# Patient Record
Sex: Female | Born: 1990 | Hispanic: Yes | Smoking: Never smoker
Health system: Southern US, Community
[De-identification: ages and names within clinical notes are randomized; demographics above are authoritative.]

## PROBLEM LIST (undated history)

## (undated) ENCOUNTER — Emergency Department: Discharge status: Absent without leave | Payer: Self-pay

---

## 2017-07-03 ENCOUNTER — Other Ambulatory Visit: Payer: Self-pay

## 2017-07-03 ENCOUNTER — Emergency Department
Admission: EM | Admit: 2017-07-03 | Discharge: 2017-07-03 | Disposition: A | Discharge status: Home / Self Care | Payer: Self-pay | Attending: Emergency Medicine | Admitting: Emergency Medicine

## 2017-07-03 ENCOUNTER — Emergency Department: Payer: Self-pay

## 2017-07-03 DIAGNOSIS — O200 Threatened abortion: Secondary | ICD-10-CM | POA: Insufficient documentation

## 2017-07-03 DIAGNOSIS — O418X10 Other specified disorders of amniotic fluid and membranes, first trimester, not applicable or unspecified: Secondary | ICD-10-CM

## 2017-07-03 DIAGNOSIS — Z3A10 10 weeks gestation of pregnancy: Secondary | ICD-10-CM | POA: Insufficient documentation

## 2017-07-03 DIAGNOSIS — O418X11 Other specified disorders of amniotic fluid and membranes, first trimester, fetus 1: Secondary | ICD-10-CM | POA: Insufficient documentation

## 2017-07-03 DIAGNOSIS — O468X1 Other antepartum hemorrhage, first trimester: Secondary | ICD-10-CM

## 2017-07-03 LAB — POCT PREGNANCY, URINE: Preg Test, Ur: POSITIVE — AB

## 2017-07-03 LAB — ABO/RH: ABO/RH(D): A POS

## 2017-07-03 LAB — HCG, QUANTITATIVE, PREGNANCY: hCG, Beta Chain, Quant, S: 113625 m[IU]/mL — ABNORMAL HIGH (ref <5)

## 2017-07-03 NOTE — ED Triage Notes (Addendum)
Pt to ER via POV from clinic for vaginal bleeding during pregnancy. Pt reports that she is approx 3 months, unsure of due date. Last period in December. Pt reports light vaginal bleeding that started this morning. Pt reports that she was feeling fetal movement prior to today, has not felt any today.   Pt has not had any prior ultrasound per her report.  Interpreter used for triage.

## 2017-07-03 NOTE — Discharge Instructions (Signed)
Fortunately your blood work and your ultrasound were reassuring.  Please maintain pelvic rest until you are able to follow-up with [facility] this coming week for reevaluation.  Return to the emergency department sooner for any concerns whatsoever.  It was a pleasure to take care of you today, and thank you for coming to our emergency department.  If you have any questions or concerns before leaving please ask the nurse to grab me and I'm more than happy to go through your aftercare instructions again.  If you were prescribed any opioid pain medication today such as Norco, Vicodin, Percocet, morphine, hydrocodone, or oxycodone please make sure you do not drive when you are taking this medication as it can alter your ability to drive safely.  If you have any concerns once you are home that you are not improving or are in fact getting worse before you can make it to your follow-up appointment, please do not hesitate to call 911 and come back for further evaluation.  Merrily BrittleNeil Moni Rothrock, MD  Results for orders placed or performed during the hospital encounter of 07/03/17  hCG, quantitative, pregnancy  Result Value Ref Range   hCG, Beta Chain, Quant, S 113,625 (H) <5 mIU/mL  Pregnancy, urine POC  Result Value Ref Range   Preg Test, Ur POSITIVE (A) NEGATIVE  ABO/Rh  Result Value Ref Range   ABO/RH(D)      A POS Performed at [facility], [address] [place], [place] [address]    [place] Ob Comp Less 14 Wks  Result Date: 07/03/2017 CLINICAL DATA:  Vaginal bleeding, decreased movement. EXAM: OBSTETRIC <14 WK ULTRASOUND TECHNIQUE: Transabdominal ultrasound was performed for evaluation of the gestation as well as the maternal uterus and adnexal regions. COMPARISON:  None. FINDINGS: Intrauterine gestational sac: Single Yolk sac:  Not seen Embryo:  Present Cardiac Activity: Present Heart Rate: 163 bpm MSD:   mm    w     d CRL:   33 mm   10 w 1 d                  US EDC: 01/28/2018 Subchorionic  hemorrhage:  Small Maternal uterus/adnexae: No abnormality identified. No mass or free fluid within either adnexal region. IMPRESSION: 1. Single live intrauterine pregnancy with estimated gestational age of [redacted] weeks and 1 day. Fetal heart rate measured at 163 beats per minute. 2. Small subchorionic bleed. 3. Maternal ovaries appear normal and there is no mass or free fluid identified within either adnexal region. Electronically Signed   By: Bary RichardStan  Maynard M.D.   On: 07/03/2017 15:13

## 2017-07-03 NOTE — ED Provider Notes (Signed)
[facility] Emergency Department Provider Note  ____________________________________________   First MD Initiated Contact with Patient 07/03/17 [phone]     (approximate)  I have reviewed the triage vital signs and the nursing notes.   HISTORY  Chief Complaint Vaginal Bleeding   HPI Julia Underwood is a 27 y.o. female who self presents to the emergency department with mild to moderate intermittent lower pelvic cramping and light vaginal bleeding that began this morning.  She is roughly 3 months pregnant with a desired pregnancy.  She is G5P3.  She was not taking fertility medications.  She denies chest pain shortness of breath.  She reports some nausea no vomiting.  She has not passed out.  Her pain is intermittent.  Nothing particular seems to make it better or worse.  She went to an unknown clinic this morning where a doctor was "unable to hear my baby's heartbeat".  History reviewed. No pertinent past medical history.  There are no active problems to display for this patient.   History reviewed. No pertinent surgical history.  Prior to Admission medications   Not on File    Allergies Patient has no known allergies.  No family history on file.  Social History Social History   Tobacco Use  . Smoking status: Never Smoker  Substance Use Topics  . Alcohol use: No    Frequency: Never  . Drug use: Not on file    Review of Systems Constitutional: No fever/chills Eyes: No visual changes. ENT: No sore throat. Cardiovascular: Denies chest pain. Respiratory: Denies shortness of breath. Gastrointestinal: Positive for abdominal pain.  Is a 4 nausea, no vomiting.  No diarrhea.  No constipation. Genitourinary: Negative for dysuria. Musculoskeletal: Negative for back pain. Skin: Negative for rash. Neurological: Negative for headaches, focal weakness or numbness.   ____________________________________________   PHYSICAL EXAM:  VITAL  SIGNS: ED Triage Vitals  Enc Vitals Group     BP 07/03/17 1417 116/80     Pulse Rate 07/03/17 1417 76     Resp 07/03/17 1417 18     Temp 07/03/17 1417 97.7 F (36.5 C)     Temp Source 07/03/17 1417 Oral     SpO2 07/03/17 1417 100 %     Weight 07/03/17 1418 176 lb (79.8 kg)     Height 07/03/17 1418 5\' 3"  (1.6 m)     Head Circumference --      Peak Flow --      Pain Score 07/03/17 1417 2     Pain Loc --      Pain Edu? --      Excl. in [place]? --     Constitutional: Alert and oriented x4 pleasant cooperative speaks full clear sentences no diaphoresis Eyes: PERRL EOMI. Head: Atraumatic. Nose: No congestion/rhinnorhea. Mouth/Throat: No trismus Neck: No stridor.   Cardiovascular: Normal rate, regular rhythm. Grossly normal heart sounds.  Good peripheral circulation. Respiratory: Normal respiratory effort.  No retractions. Lungs CTAB and moving good air Gastrointestinal: Soft nontender no peritonitis Musculoskeletal: No lower extremity edema   Neurologic:  Normal speech and language. No gross focal neurologic deficits are appreciated. Skin:  Skin is warm, dry and intact. No rash noted. Psychiatric: Mood and affect are normal. Speech and behavior are normal.    ____________________________________________   DIFFERENTIAL includes but not limited to  Ectopic pregnancy, threatened abortion, inevitable abortion, completed abortion, septic abortion ____________________________________________   LABS (all labs ordered are listed, but only abnormal results are displayed)  Labs  Reviewed  HCG, QUANTITATIVE, PREGNANCY - Abnormal; Notable for the following components:      Result Value   hCG, Beta Chain, [place] LongestQuant, S [id number],[id number] (*)    All other components within normal limits  POCT PREGNANCY, URINE - Abnormal; Notable for the following components:   Preg Test, Ur POSITIVE (*)    All other components within normal limits  ABO/RH    Lab work reviewed by me shows the patient is  Rh+ __________________________________________  EKG   ____________________________________________  RADIOLOGY  Ultrasound reviewed by me shows normal fetal heart rate with intrauterine pregnancy and small subchorionic hematoma ____________________________________________   PROCEDURES  Procedure(s) performed: no  Procedures  Critical Care performed: no  Observation: no ____________________________________________   INITIAL IMPRESSION / ASSESSMENT AND PLAN / ED COURSE  Pertinent labs & imaging results that were available during my care of the patient were reviewed by me and considered in my medical decision making (see chart for details).  The patient is hemodynamically stable and well-appearing with roughly 10-week fetus.  She is Rh+ and does not need RhoGam.  She does have a small subchorionic hematoma.  I discussed with the patient the possibility of a miscarriage and the importance of pelvic rest until she is able to follow-up with [facility].  She does not need a referral.  Strict return precautions have been given and the patient is discharged home in improved condition and verbalizes understanding and agreement with the plan.      ____________________________________________   FINAL CLINICAL IMPRESSION(S) / ED DIAGNOSES  Final diagnoses:  Threatened abortion  Subchorionic hematoma in first trimester, single or unspecified fetus      NEW MEDICATIONS STARTED DURING THIS VISIT:  There are no discharge medications for this patient.    Note:  This document was prepared using Dragon voice recognition software and may include unintentional dictation errors.     Merrily Brittleifenbark, Ionia Schey, MD 07/05/17 1309

## 2017-07-03 NOTE — ED Notes (Signed)
Pt reports being 3 months pregnant and having cramps as of last night. Pt reports light vaginal bleed that occurred this morning. Pt was then seen by a doctor at the clinic who was unable to hear the baby's heart beat via ultra sound. Pt was then referred to [facility] ED for further examination.

## 2018-10-23 ENCOUNTER — Other Ambulatory Visit: Payer: Self-pay | Admitting: Family Medicine

## 2018-10-23 DIAGNOSIS — Z20822 Contact with and (suspected) exposure to covid-19: Secondary | ICD-10-CM

## 2018-10-29 LAB — NOVEL CORONAVIRUS, NAA: SARS-CoV-2, NAA: NOT DETECTED

## 2018-11-05 ENCOUNTER — Emergency Department: Payer: Self-pay

## 2018-11-05 ENCOUNTER — Encounter: Payer: Self-pay | Admitting: Emergency Medicine

## 2018-11-05 DIAGNOSIS — Y929 Unspecified place or not applicable: Secondary | ICD-10-CM | POA: Insufficient documentation

## 2018-11-05 DIAGNOSIS — Y998 Other external cause status: Secondary | ICD-10-CM | POA: Insufficient documentation

## 2018-11-05 DIAGNOSIS — Y9389 Activity, other specified: Secondary | ICD-10-CM | POA: Insufficient documentation

## 2018-11-05 DIAGNOSIS — X509XXA Other and unspecified overexertion or strenuous movements or postures, initial encounter: Secondary | ICD-10-CM | POA: Insufficient documentation

## 2018-11-05 DIAGNOSIS — M25512 Pain in left shoulder: Secondary | ICD-10-CM | POA: Insufficient documentation

## 2018-11-05 DIAGNOSIS — S29012A Strain of muscle and tendon of back wall of thorax, initial encounter: Secondary | ICD-10-CM | POA: Insufficient documentation

## 2018-11-05 NOTE — ED Triage Notes (Signed)
Patient c/o left back/shoulder pain radiating to left chest and arm. Pain is intermittent - patient not currently in pain. Patient describes pain in arm as a numbness. 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 Patient c/o chest pain with deep inspiration. Patient only speaks spanish, interpreter used to triage patient.   Per MD Joni Fears only chest xray and EKG to be performed at this time.

## 2018-11-06 ENCOUNTER — Emergency Department
Admission: EM | Admit: 2018-11-06 | Discharge: 2018-11-06 | Disposition: A | Discharge status: Home / Self Care | Payer: Self-pay | Attending: Emergency Medicine | Admitting: Emergency Medicine

## 2018-11-06 DIAGNOSIS — S29012A Strain of muscle and tendon of back wall of thorax, initial encounter: Secondary | ICD-10-CM

## 2018-11-06 MED ORDER — CYCLOBENZAPRINE HCL 10 MG PO TABS: 10.0000 mg | ORAL_TABLET | Freq: Three times a day (TID) | ORAL | 0 refills | Status: DC | PRN

## 2018-11-06 MED ORDER — LIDOCAINE 5 % EX PTCH
1.0000 | MEDICATED_PATCH | CUTANEOUS | Status: DC
  Administered 2018-11-06: 1 via TRANSDERMAL
  Filled 2018-11-06: qty 1

## 2018-11-06 MED ORDER — KETOROLAC TROMETHAMINE 10 MG PO TABS
10.0000 mg | ORAL_TABLET | Freq: Once | ORAL | Status: AC
  Administered 2018-11-06: 10 mg via ORAL
  Filled 2018-11-06: qty 1

## 2018-11-06 MED ORDER — CYCLOBENZAPRINE HCL 10 MG PO TABS
10.0000 mg | ORAL_TABLET | Freq: Once | ORAL | Status: AC
  Administered 2018-11-06: 10 mg via ORAL
  Filled 2018-11-06: qty 1

## 2018-11-06 NOTE — ED Provider Notes (Signed)
[facility] Emergency Department Provider Note   First MD Initiated Contact with Patient 11/06/18 0224     (approximate)  I have reviewed the triage vital signs and the nursing notes.   HISTORY  Chief Complaint Back Pain and Chest Pain    HPI Julia Underwood is a 28 y.o. female presents to the emergency department secondary to left upper back/shoulder discomfort x3 days after painting with her left arm.  Patient denies any chest discomfort.  Patient denies any weakness or neck discomfort.  Patient states that pain is worsened with movement of the left arm/shoulder.  Patient states that pain is also worsened with palpation.   History reviewed. No pertinent past medical history.  There are no active problems to display for this patient.   History reviewed. No pertinent surgical history.  Prior to Admission medications   Not on File    Allergies Patient has no known allergies.  No family history on file.  Social History Social History   Tobacco Use  . Smoking status: Never Smoker  . Smokeless tobacco: Never Used  Substance Use Topics  . Alcohol use: No    Frequency: Never  . Drug use: Not on file    Review of Systems Constitutional: No fever/chills Eyes: No visual changes. ENT: No sore throat. Cardiovascular: Denies chest pain. Respiratory: Denies shortness of breath. Gastrointestinal: No abdominal pain.  No nausea, no vomiting.  No diarrhea.  No constipation. Genitourinary: Negative for dysuria. Musculoskeletal: Negative for neck pain.  Positive for back pain.  Positive for left shoulder discomfort. Integumentary: Negative for rash. Neurological: Negative for headaches, focal weakness or numbness.   ____________________________________________   PHYSICAL EXAM:  VITAL SIGNS: ED Triage Vitals  Enc Vitals Group     BP 11/05/18 2154 115/79     Pulse Rate 11/05/18 2154 83     Resp 11/05/18 2154 18     Temp 11/05/18  2154 99.3 F (37.4 C)     Temp src --      SpO2 11/05/18 2154 100 %     Weight 11/05/18 2211 59 kg (130 lb)     Height 11/05/18 2211 1.626 m (5\' 4" )     Head Circumference --      Peak Flow --      Pain Score 11/05/18 2211 0     Pain Loc --      Pain Edu? --      Excl. in [place]? --     Constitutional: Alert and oriented. Well appearing and in no acute distress. Eyes: Conjunctivae are normal.  Mouth/Throat: Mucous membranes are moist. Neck: No stridor. Cardiovascular: Normal rate, regular rhythm. Good peripheral circulation. Grossly normal heart sounds. Respiratory: Normal respiratory effort.  No retractions. No audible wheezing. Gastrointestinal: Soft and nontender. No distention.  Musculoskeletal: Pain to palpation of the left rhomboid/trapezius muscles Neurologic:  Normal speech and language. No gross focal neurologic deficits are appreciated.  Skin:  Skin is warm, dry and intact. No rash noted. Psychiatric: Mood and affect are normal. Speech and behavior are normal.  ____  RADIOLOGY I, [place] Dezzie Badilla, personally viewed and evaluated these images (plain radiographs) as part of my medical decision making, as well as reviewing the written report by the radiologist.  ED MD interpretation: No active cardiopulmonary disease noted on chest x-ray per radiologist.  Official radiology report(s): Dg Chest 2 View  Result Date: 11/05/2018 CLINICAL DATA:  Initial evaluation for acute left back/shoulder pain radiating into the  left chest and arm. EXAM: CHEST - 2 VIEW COMPARISON:  None available. FINDINGS: The cardiac and mediastinal silhouettes are within normal limits. The lungs are normally inflated. No focal infiltrates. No pulmonary edema. Minimal blunting of the costophrenic angles bilaterally could reflect trace pleural effusions. No pneumothorax. No acute osseous finding. IMPRESSION: 1. Minimal blunting of the costophrenic angles bilaterally, which could reflect trace bilateral pleural  effusions. 2. No other active cardiopulmonary disease. Electronically Signed   By: Rise MuBenjamin  McClintock M.D.   On: 11/05/2018 23:41      Procedures   ____________________________________________   INITIAL IMPRESSION / MDM / ASSESSMENT AND PLAN / ED COURSE  As part of my medical decision making, I reviewed the following data within the electronic MEDICAL RECORD NUMBER   28 year old female presented with above-stated history and physical exam consistent with musculoskeletal pain less likely secondary to muscular strain.  Lidoderm patch applied to the rhomboid muscles.  Patient given Toradol in the emergency department.  Patient be was prescribed Flexeril for home.  ____________________________________________  FINAL CLINICAL IMPRESSION(S) / ED DIAGNOSES  Final diagnoses:  Muscle strain of upper back     MEDICATIONS GIVEN DURING THIS VISIT:  Medications  lidocaine (LIDODERM) 5 % 1 patch (1 patch Transdermal Patch Applied 11/06/18 0229)  ketorolac (TORADOL) tablet 10 mg (has no administration in time range)  cyclobenzaprine (FLEXERIL) tablet 10 mg (has no administration in time range)     ED Discharge Orders    None      *Please note:  Julia Underwood was evaluated in Emergency Department on 11/06/2018 for the symptoms described in the history of present illness. She was evaluated in the context of the global COVID-19 pandemic, which necessitated consideration that the patient might be at risk for infection with the SARS-CoV-2 virus that causes COVID-19. Institutional protocols and algorithms that pertain to the evaluation of patients at risk for COVID-19 are in a state of rapid change based on information released by regulatory bodies including the CDC and federal and state organizations. These policies and algorithms were followed during the patient's care in the ED.  Some ED evaluations and interventions may be delayed as a result of limited staffing during the pandemic.*   Note:  This document was prepared using Dragon voice recognition software and may include unintentional dictation errors.   Darci CurrentBrown, [place] N, MD 11/06/18 [phone]

## 2018-11-06 NOTE — ED Notes (Signed)
Pt signed physical copy of discharge paperwork.

## 2018-12-21 ENCOUNTER — Encounter: Payer: Self-pay | Admitting: Emergency Medicine

## 2018-12-21 ENCOUNTER — Emergency Department
Admission: EM | Admit: 2018-12-21 | Discharge: 2018-12-21 | Disposition: A | Discharge status: Home / Self Care | Payer: Self-pay | Attending: Student | Admitting: Student

## 2018-12-21 ENCOUNTER — Other Ambulatory Visit: Payer: Self-pay

## 2018-12-21 DIAGNOSIS — H6123 Impacted cerumen, bilateral: Secondary | ICD-10-CM | POA: Insufficient documentation

## 2018-12-21 NOTE — ED Provider Notes (Signed)
[facility] Emergency Department Provider Note   ____________________________________________   First MD Initiated Contact with Patient 12/21/18 [phone]     (approximate)  I have reviewed the triage vital signs and the nursing notes.   HISTORY Via interpreter Chief Complaint Cerumen Impaction    HPI Julia Underwood is a 28 y.o. female patient complain of mild hearing loss secondary to wax buildup in both ears.  Patient states this is a intermittent condition occurs approximately every 6 months.  Patient state she cannot see her PCP until next week.         History reviewed. No pertinent past medical history.  There are no active problems to display for this patient.   History reviewed. No pertinent surgical history.  Prior to Admission medications   Not on File    Allergies Patient has no known allergies.  History reviewed. No pertinent family history.  Social History Social History   Tobacco Use  . Smoking status: Never Smoker  . Smokeless tobacco: Never Used  Substance Use Topics  . Alcohol use: No    Frequency: Never  . Drug use: Not on file    Review of Systems Constitutional: No fever/chills Eyes: No visual changes. ENT: No sore throat.  Decreased hearing. Cardiovascular: Denies chest pain. Respiratory: Denies shortness of breath. Gastrointestinal: No abdominal pain.  No nausea, no vomiting.  No diarrhea.  No constipation. Genitourinary: Negative for dysuria. Musculoskeletal: Negative for back pain. Skin: Negative for rash. Neurological: Negative for headaches, focal weakness or numbness.   ____________________________________________   PHYSICAL EXAM:  VITAL SIGNS: ED Triage Vitals  Enc Vitals Group     BP 12/21/18 1613 117/86     Pulse Rate 12/21/18 1612 73     Resp 12/21/18 1612 14     Temp 12/21/18 1612 99.2 F (37.3 C)     Temp Source 12/21/18 1612 Oral     SpO2 12/21/18 1612 99 %     Weight  12/21/18 1611 130 lb (59 kg)     Height --      Head Circumference --      Peak Flow --      Pain Score 12/21/18 1611 0     Pain Loc --      Pain Edu? --      Excl. in [place]? --     Constitutional: Alert and oriented. Well appearing and in no acute distress. EARS: Bilateral cerumen impaction. Cardiovascular: Normal rate, regular rhythm. Grossly normal heart sounds.  Good peripheral circulation. Respiratory: Normal respiratory effort.  No retractions. Lungs CTAB. Neurologic:  Normal speech and language. No gross focal neurologic deficits are appreciated. No gait instability. Skin:  Skin is warm, dry and intact. No rash noted. Psychiatric: Mood and affect are normal. Speech and behavior are normal.  ____________________________________________   LABS (all labs ordered are listed, but only abnormal results are displayed)  Labs Reviewed - No data to display ____________________________________________  EKG   ____________________________________________  RADIOLOGY  ED MD interpretation:    Official radiology report(s): No results found.  ____________________________________________   PROCEDURES  Procedure(s) performed (including Critical Care):  .Ear Cerumen Removal  Date/Time: 12/21/2018 4:58 PM Performed by: McLamb, Berniece Salines, RN Authorized by: Sable Feil, PA-C   Consent:    Consent obtained:  Verbal   Consent given by:  Patient   Risks discussed:  Pain, incomplete removal and dizziness Procedure details:    Location:  R ear and L ear  Procedure type: irrigation   Post-procedure details:    Inspection:  TM intact   Hearing quality:  Normal   Patient tolerance of procedure:  Tolerated well, no immediate complications     ____________________________________________   INITIAL IMPRESSION / ASSESSMENT AND PLAN / ED COURSE  As part of my medical decision making, I reviewed the following data within the electronic MEDICAL RECORD NUMBER         Julia  Anzora de Ardyth Underwood was evaluated in Emergency Department on 12/21/2018 for the symptoms described in the history of present illness. She was evaluated in the context of the global COVID-19 pandemic, which necessitated consideration that the patient might be at risk for infection with the SARS-CoV-2 virus that causes COVID-19. Institutional protocols and algorithms that pertain to the evaluation of patients at risk for COVID-19 are in a state of rapid change based on information released by regulatory bodies including the CDC and federal and state organizations. These policies and algorithms were followed during the patient's care in the ED.  Mild hearing loss secondary to cerumen impaction.  Complaint resolved status post bilateral ear irrigation.  Patient advised follow-up PCP.      ____________________________________________   FINAL CLINICAL IMPRESSION(S) / ED DIAGNOSES  Final diagnoses:  Hearing loss of both ears due to cerumen impaction     ED Discharge Orders    None       Note:  This document was prepared using Dragon voice recognition software and may include unintentional dictation errors.    Joni ReiningSmith, Jeyden Coffelt K, PA-C 12/21/18 1730    Miguel AschoffMonks, Sarah L., MD 12/22/18 [phone]

## 2018-12-21 NOTE — ED Triage Notes (Signed)
Pt here for wax build up in both ears but worse in left. Denies pain.  Sx X 3 days. Reports called her doctor but they cannot see until Thursday and wanted it taken care of sooner.

## 2018-12-21 NOTE — ED Notes (Signed)
See triage note  Presents with possible wax build up in both ears   States she has tried to clean her ears out  Min wax noted no fever noted

## 2020-01-05 IMAGING — US US OB COMP LESS 14 WK
1 series · 14 of 28 positions shown · non-contrast
Comparison: None.

CLINICAL DATA: Vaginal bleeding, decreased movement.

EXAM:
OBSTETRIC <14 WK ULTRASOUND
TECHNIQUE: Transabdominal ultrasound was performed for evaluation of the
gestation as well as the maternal uterus and adnexal regions.

[Series 1: us ob comp less 14 wk · 0.15mm/px · 54 acquisitions, 14 frames shown]
[im 2/54]
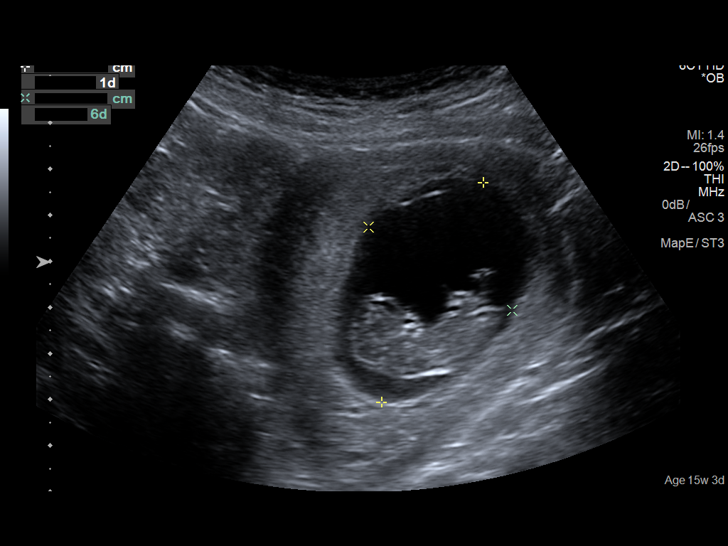
[im 6/54]
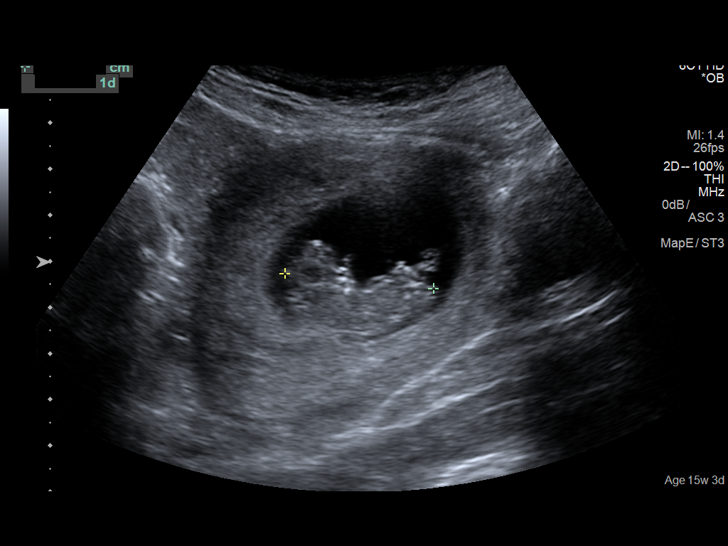
[im 10/54]
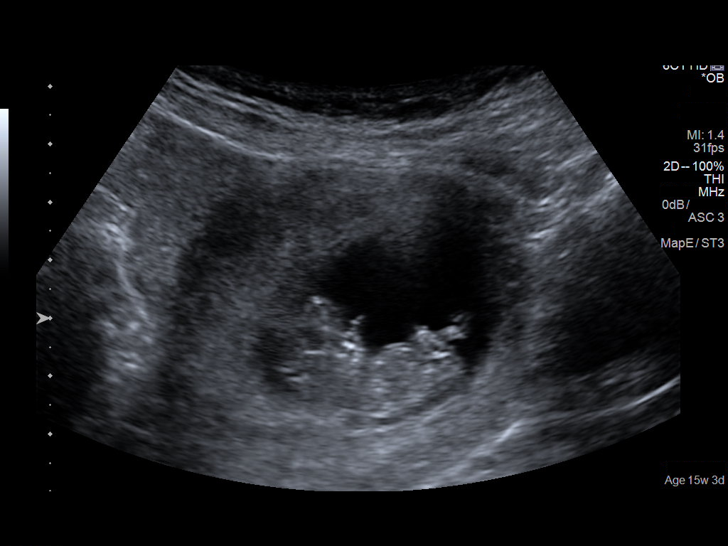
[im 14/54]
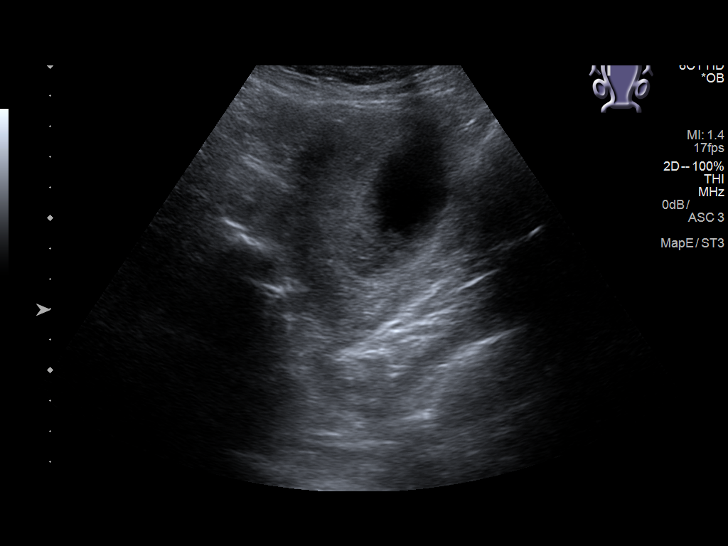
[im 18/54]
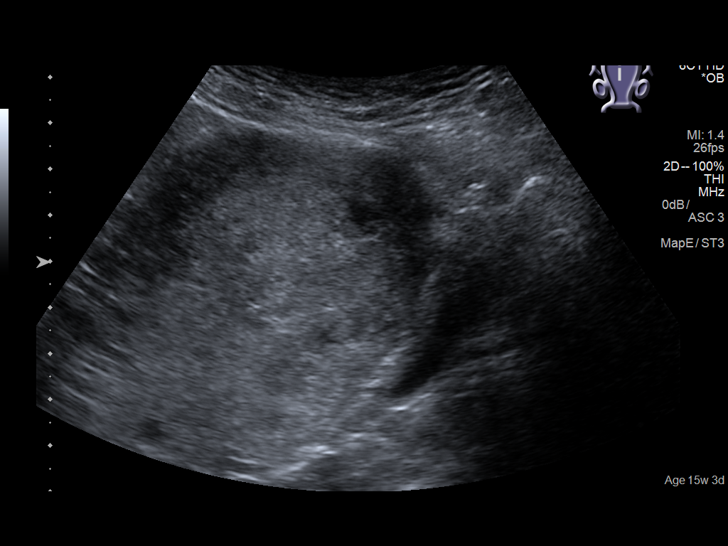
[im 22/54]
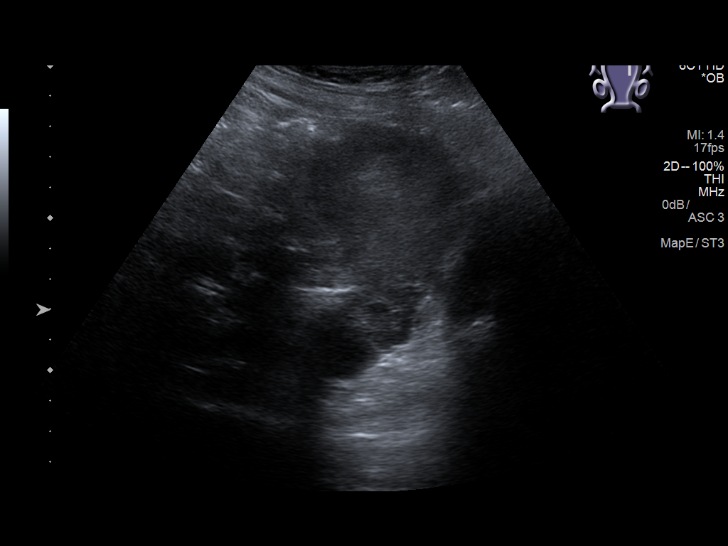
[im 26/54]
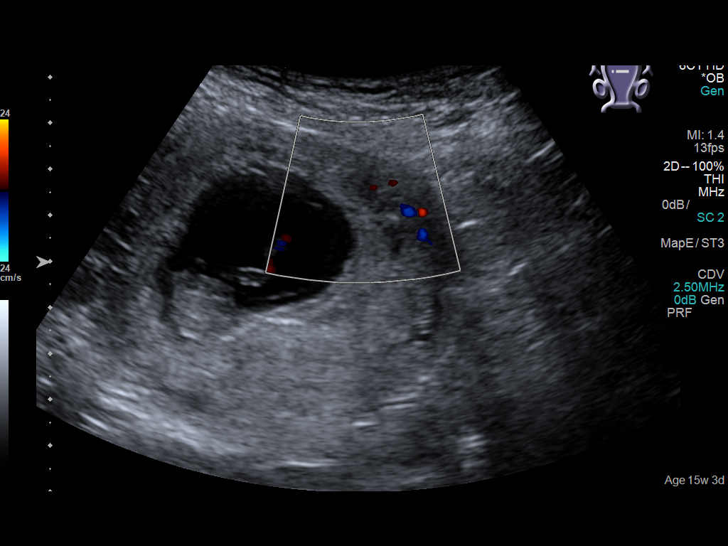
[im 30/54]
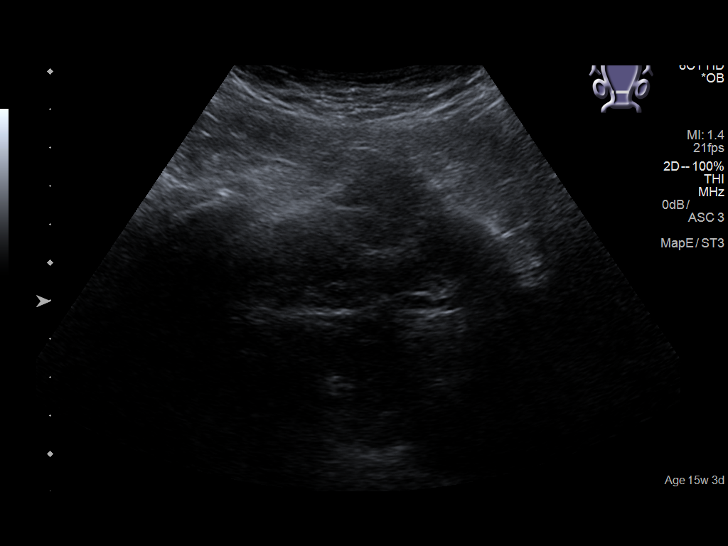
[im 34/54]
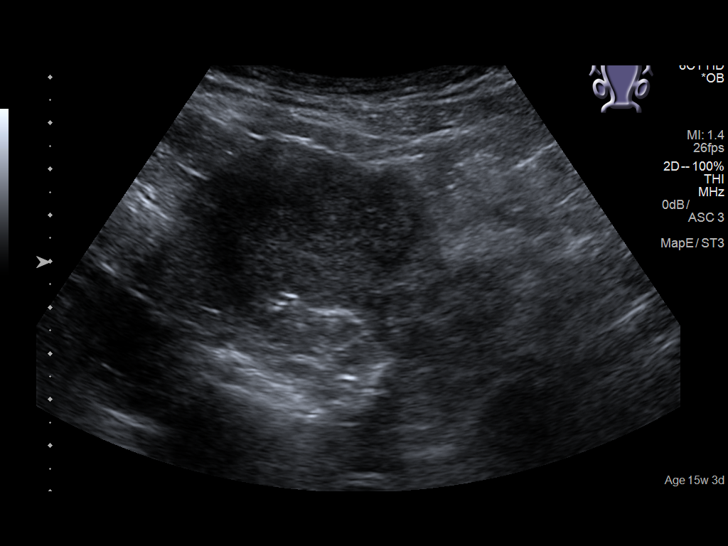
[im 38/54]
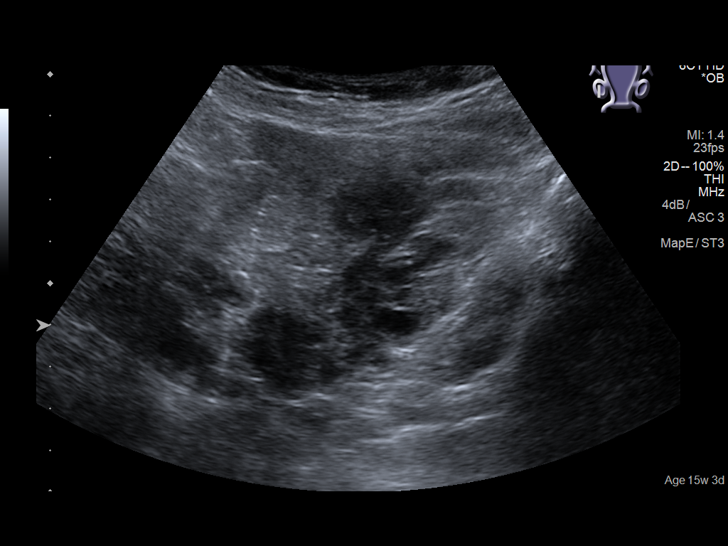
[im 42/54]
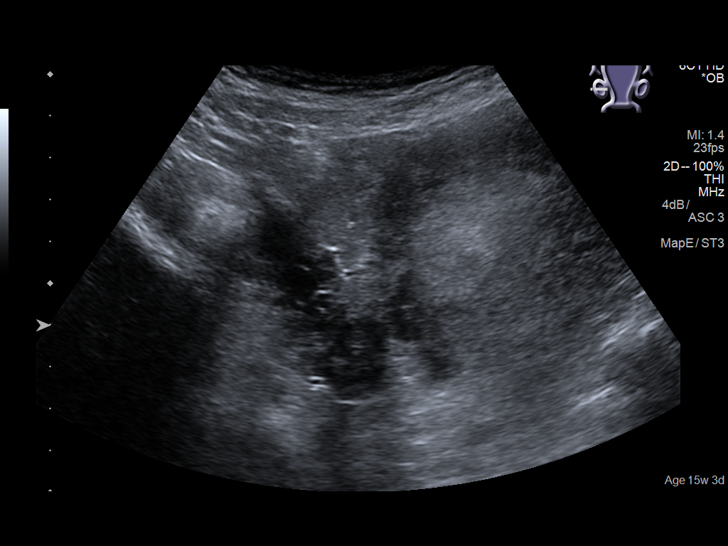
[im 46/54]
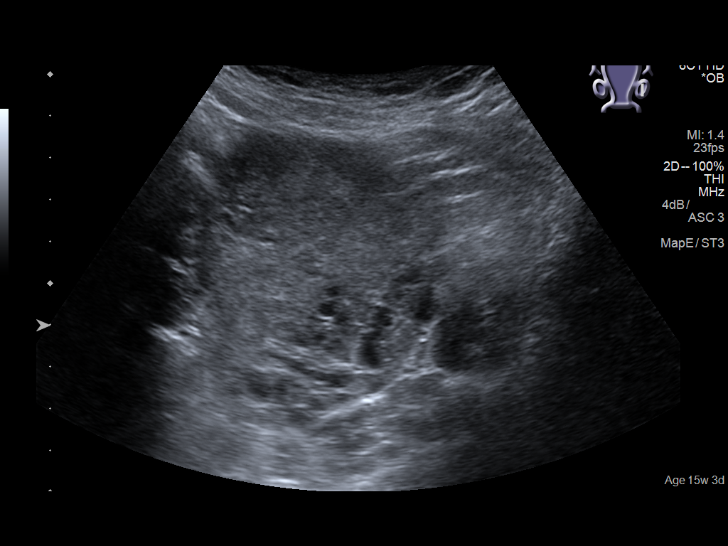
[im 50/54]
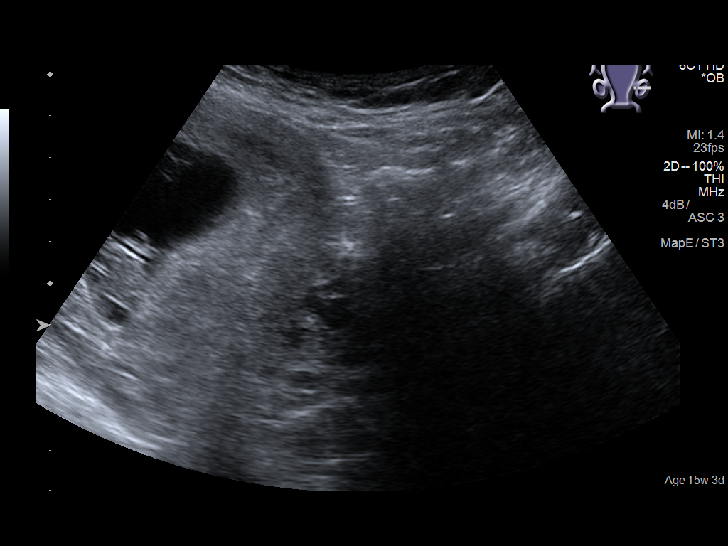
[im 54/54]
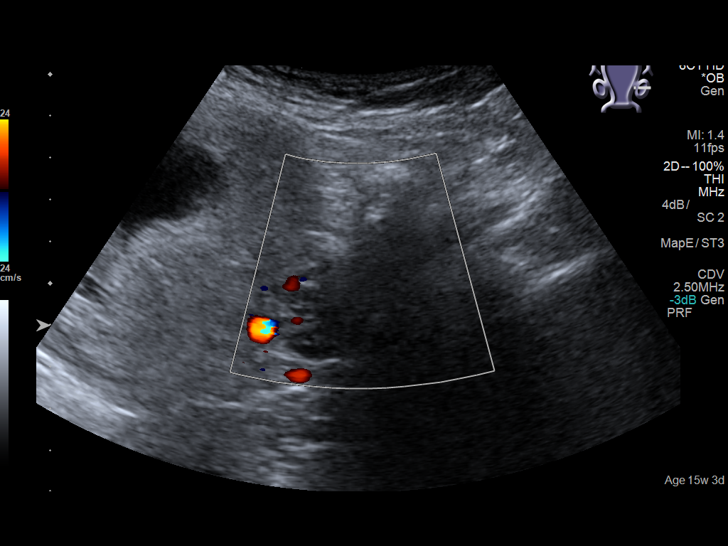

[14 of 28 positions shown; findings below may reference images not displayed]

FINDINGS: Intrauterine gestational sac: Single

Yolk sac:  Not seen

Embryo:  Present

Cardiac Activity: Present

Heart Rate: 163 bpm

MSD:   mm    w     d

CRL:   33 mm   10 w 1 d                  US EDC: 01/28/2018

Subchorionic hemorrhage:  Small

Maternal uterus/adnexae: No abnormality identified. No mass or free
fluid within either adnexal region.
IMPRESSION: 1. Single live intrauterine pregnancy with estimated gestational age
of 10 weeks and 1 day. Fetal heart rate measured at 163 beats per
minute.
2. Small subchorionic bleed.
3. Maternal ovaries appear normal and there is no mass or free fluid
identified within either adnexal region.

## 2021-06-07 IMAGING — CR CHEST - 2 VIEW
1 series · 2 of 2 positions shown · non-contrast
Comparison: None available.

CLINICAL DATA: Initial evaluation for acute left back/shoulder pain
radiating into the left chest and arm.

EXAM:
CHEST - 2 VIEW

[Series 1: dg chest 2 view · 0.14mm/px · 2 of 2 slices shown]
[im 1/2]
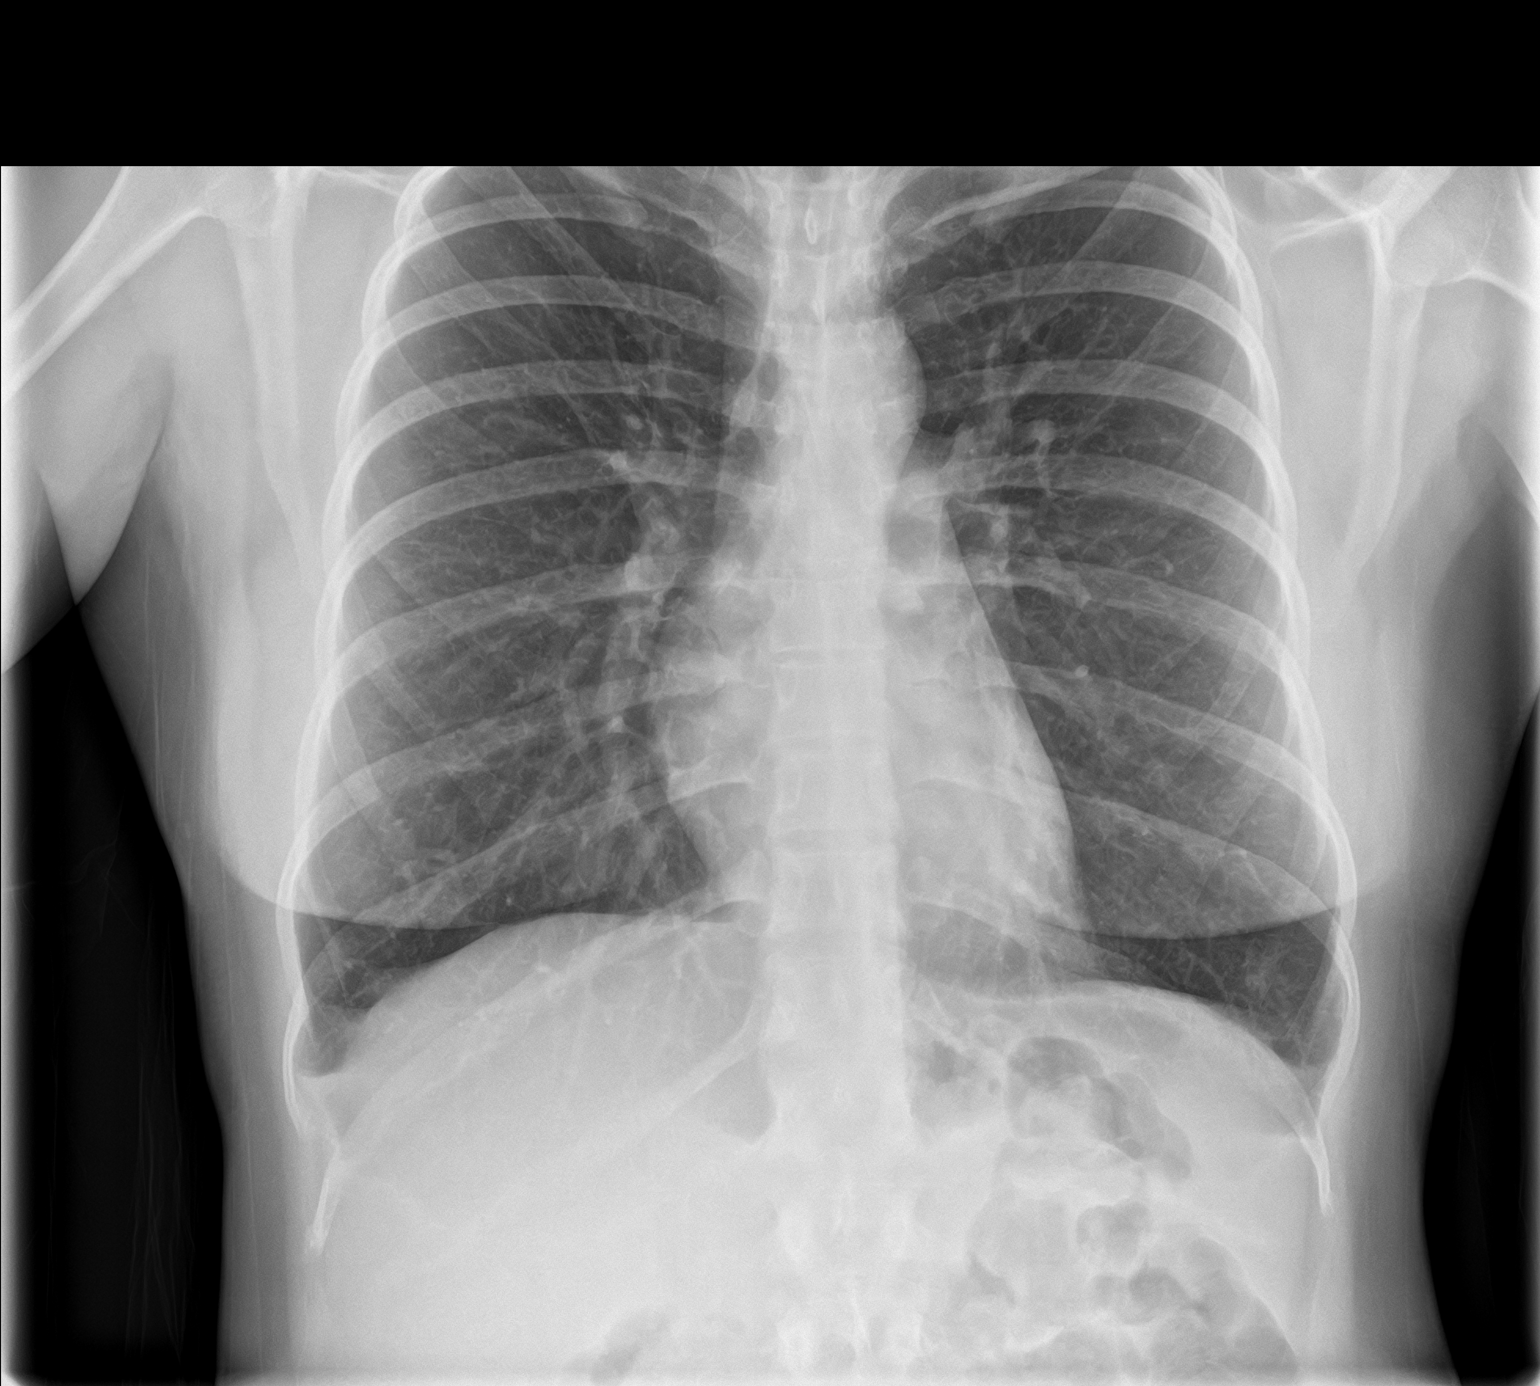
[im 2/2]
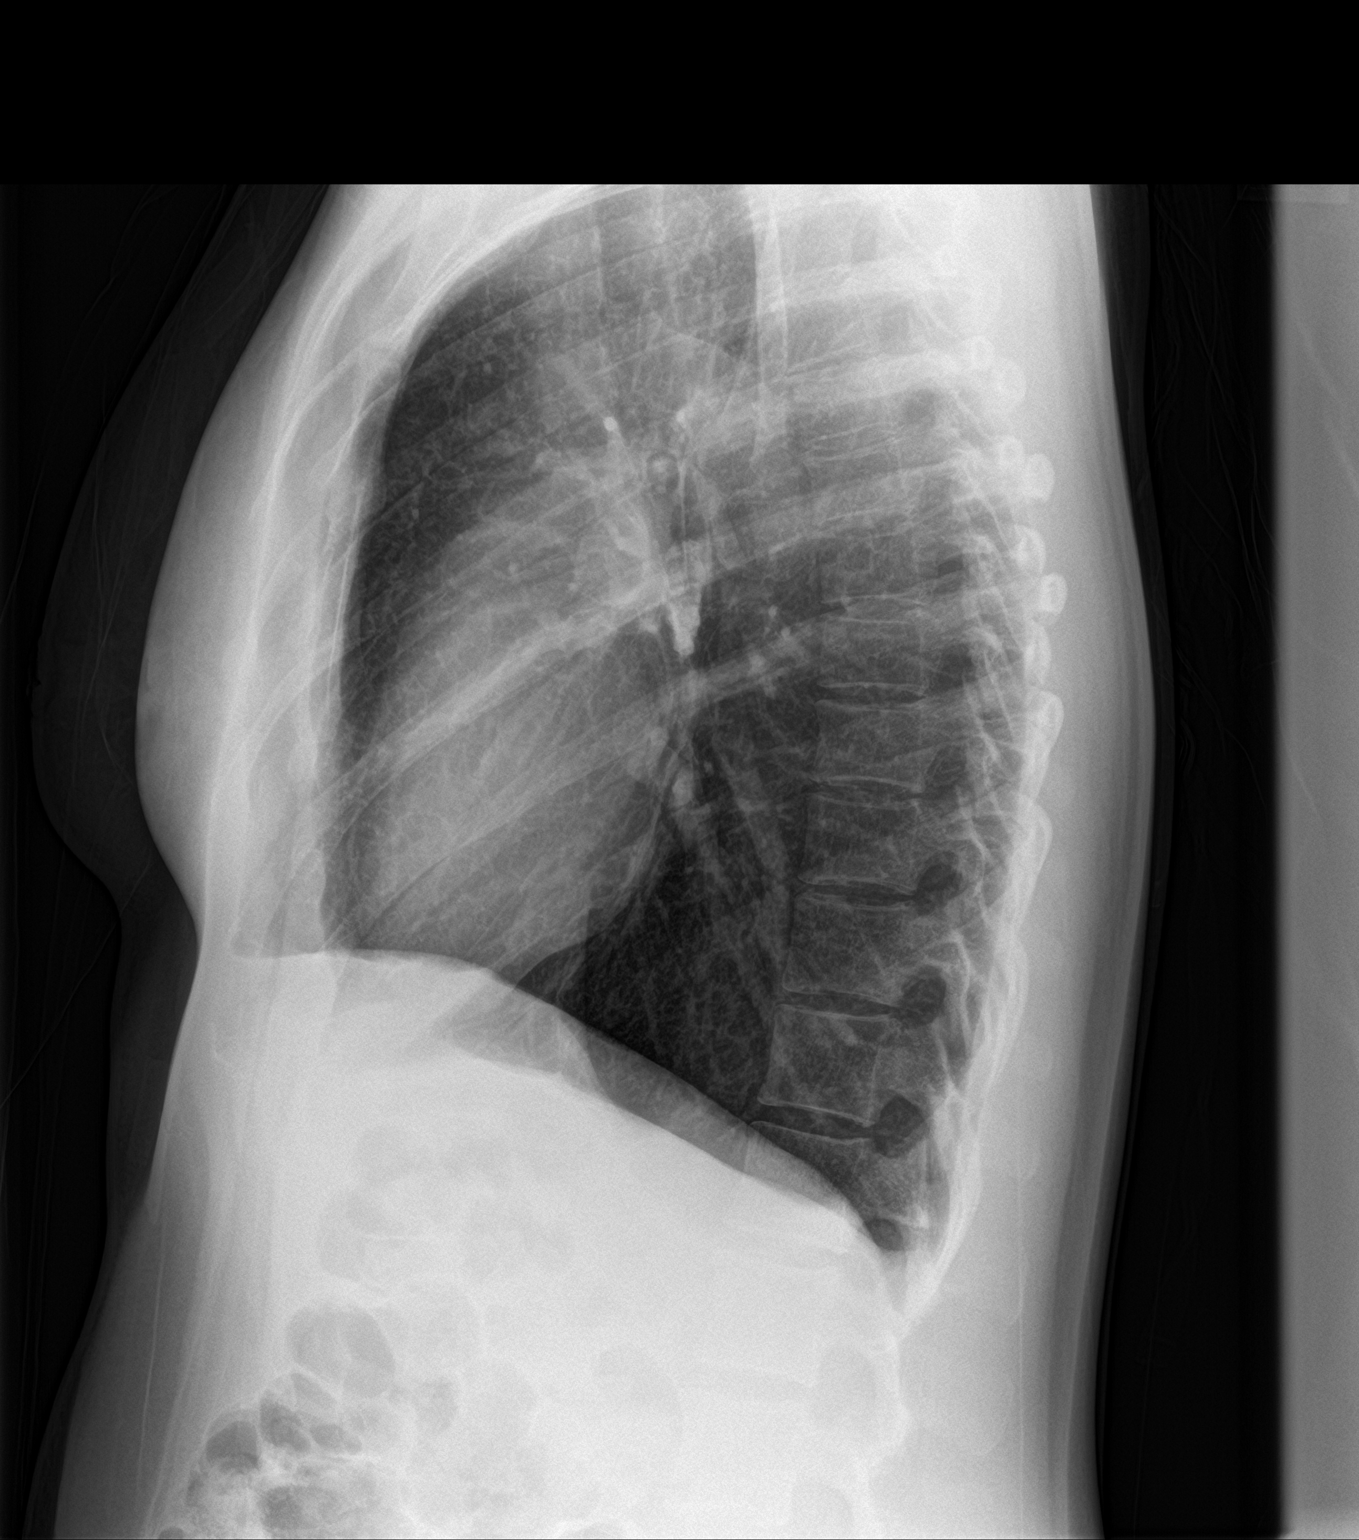

[2 of 2 positions shown; findings below may reference images not displayed]

FINDINGS: The cardiac and mediastinal silhouettes are within normal limits.

The lungs are normally inflated. No focal infiltrates. No pulmonary
edema. Minimal blunting of the costophrenic angles bilaterally could
reflect trace pleural effusions. No pneumothorax.

No acute osseous finding.
IMPRESSION: 1. Minimal blunting of the costophrenic angles bilaterally, which
could reflect trace bilateral pleural effusions.
2. No other active cardiopulmonary disease.
